# Patient Record
Sex: Male | Born: 1990 | Race: White | Hispanic: No | Marital: Single | Smoking: Current every day smoker
Health system: Southern US, Community
[De-identification: ages and names within clinical notes are randomized; demographics above are authoritative.]

---
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
## 2017-11-04 ENCOUNTER — Encounter (HOSPITAL_COMMUNITY): Payer: Self-pay | Admitting: *Deleted

## 2017-11-04 ENCOUNTER — Other Ambulatory Visit: Payer: Self-pay

## 2017-11-04 ENCOUNTER — Emergency Department (HOSPITAL_COMMUNITY)
Admission: EM | Admit: 2017-11-04 | Discharge: 2017-11-04 | Disposition: A | Discharge status: Home / Self Care | Payer: Self-pay | Attending: Emergency Medicine | Admitting: Emergency Medicine

## 2017-11-04 ENCOUNTER — Emergency Department (HOSPITAL_COMMUNITY): Payer: Self-pay

## 2017-11-04 DIAGNOSIS — R7989 Other specified abnormal findings of blood chemistry: Secondary | ICD-10-CM | POA: Insufficient documentation

## 2017-11-04 DIAGNOSIS — R197 Diarrhea, unspecified: Secondary | ICD-10-CM | POA: Insufficient documentation

## 2017-11-04 DIAGNOSIS — R109 Unspecified abdominal pain: Secondary | ICD-10-CM

## 2017-11-04 DIAGNOSIS — R1011 Right upper quadrant pain: Secondary | ICD-10-CM | POA: Insufficient documentation

## 2017-11-04 DIAGNOSIS — F17200 Nicotine dependence, unspecified, uncomplicated: Secondary | ICD-10-CM | POA: Insufficient documentation

## 2017-11-04 DIAGNOSIS — R112 Nausea with vomiting, unspecified: Secondary | ICD-10-CM | POA: Insufficient documentation

## 2017-11-04 LAB — COMPREHENSIVE METABOLIC PANEL
ALT: 26 U/L (ref 0–44)
ALT: 20 U/L (ref 0–44)
AST: 42 U/L — ABNORMAL HIGH (ref 15–41)
AST: 33 U/L (ref 15–41)
Albumin: 4.7 g/dL (ref 3.5–5.0)
Albumin: 3.6 g/dL (ref 3.5–5.0)
Alkaline Phosphatase: 60 U/L (ref 38–126)
Alkaline Phosphatase: 45 U/L (ref 38–126)
Anion gap: 17 — ABNORMAL HIGH (ref 5–15)
Anion gap: 13 (ref 5–15)
BUN: 23 mg/dL — ABNORMAL HIGH (ref 6–20)
BUN: 21 mg/dL — ABNORMAL HIGH (ref 6–20)
CHLORIDE: 106 mmol/L (ref 98–111)
CO2: 17 mmol/L — ABNORMAL LOW (ref 22–32)
CO2: 18 mmol/L — AB (ref 22–32)
Calcium: 9.3 mg/dL (ref 8.9–10.3)
Calcium: 8.4 mg/dL — ABNORMAL LOW (ref 8.9–10.3)
Chloride: 100 mmol/L (ref 98–111)
Creatinine, Ser: 2.17 mg/dL — ABNORMAL HIGH (ref 0.61–1.24)
Creatinine, Ser: 2.01 mg/dL — ABNORMAL HIGH (ref 0.61–1.24)
GFR calc Af Amer: 47 mL/min — ABNORMAL LOW (ref >60)
GFR calc non Af Amer: 40 mL/min — ABNORMAL LOW (ref >60)
GFR calc non Af Amer: 44 mL/min — ABNORMAL LOW (ref >60)
GFR, EST AFRICAN AMERICAN: 51 mL/min — AB (ref >60)
Glucose, Bld: 108 mg/dL — ABNORMAL HIGH (ref 70–99)
Glucose, Bld: 64 mg/dL — ABNORMAL LOW (ref 70–99)
Potassium: 4 mmol/L (ref 3.5–5.1)
Potassium: 4.4 mmol/L (ref 3.5–5.1)
SODIUM: 137 mmol/L (ref 135–145)
Sodium: 134 mmol/L — ABNORMAL LOW (ref 135–145)
Total Bilirubin: 1.4 mg/dL — ABNORMAL HIGH (ref 0.3–1.2)
Total Bilirubin: 1.5 mg/dL — ABNORMAL HIGH (ref 0.3–1.2)
Total Protein: 7.5 g/dL (ref 6.5–8.1)
Total Protein: 5.7 g/dL — ABNORMAL LOW (ref 6.5–8.1)

## 2017-11-04 LAB — CBG MONITORING, ED
GLUCOSE-CAPILLARY: 114 mg/dL — AB (ref 70–99)
Glucose-Capillary: 60 mg/dL — ABNORMAL LOW (ref 70–99)

## 2017-11-04 LAB — I-STAT CG4 LACTIC ACID, ED: LACTIC ACID, VENOUS: 1.02 mmol/L (ref 0.5–1.9)

## 2017-11-04 LAB — URINALYSIS, ROUTINE W REFLEX MICROSCOPIC
Bacteria, UA: NONE SEEN
Bilirubin Urine: NEGATIVE
Glucose, UA: NEGATIVE mg/dL
Ketones, ur: 20 mg/dL — AB
Leukocytes, UA: NEGATIVE
Nitrite: NEGATIVE
Protein, ur: 30 mg/dL — AB
Specific Gravity, Urine: 1.005 (ref 1.005–1.030)
pH: 5 (ref 5.0–8.0)

## 2017-11-04 LAB — LIPASE, BLOOD: Lipase: 26 U/L (ref 11–51)

## 2017-11-04 LAB — CBC
HCT: 49 % (ref 39.0–52.0)
Hemoglobin: 16.7 g/dL (ref 13.0–17.0)
MCH: 31.5 pg (ref 26.0–34.0)
MCHC: 34.1 g/dL (ref 30.0–36.0)
MCV: 92.3 fL (ref 78.0–100.0)
Platelets: 199 10*3/uL (ref 150–400)
RBC: 5.31 MIL/uL (ref 4.22–5.81)
RDW: 12.7 % (ref 11.5–15.5)
WBC: 15.7 10*3/uL — ABNORMAL HIGH (ref 4.0–10.5)

## 2017-11-04 MED ORDER — LACTATED RINGERS IV BOLUS
1000.0000 mL | Freq: Once | INTRAVENOUS | Status: AC
  Administered 2017-11-04: 1000 mL via INTRAVENOUS

## 2017-11-04 MED ORDER — ONDANSETRON HCL 4 MG/2ML IJ SOLN
4.0000 mg | Freq: Once | INTRAMUSCULAR | Status: AC
  Administered 2017-11-04: 4 mg via INTRAVENOUS
  Filled 2017-11-04: qty 2

## 2017-11-04 MED ORDER — MORPHINE SULFATE (PF) 4 MG/ML IV SOLN
4.0000 mg | Freq: Once | INTRAVENOUS | Status: AC
  Administered 2017-11-04: 4 mg via INTRAVENOUS
  Filled 2017-11-04: qty 1

## 2017-11-04 MED ORDER — ACETAMINOPHEN 500 MG PO TABS
1000.0000 mg | ORAL_TABLET | Freq: Once | ORAL | Status: AC
  Administered 2017-11-04: 1000 mg via ORAL
  Filled 2017-11-04: qty 2

## 2017-11-04 MED ORDER — SODIUM CHLORIDE 0.9 % IV BOLUS
1000.0000 mL | Freq: Once | INTRAVENOUS | Status: AC
  Administered 2017-11-04: 1000 mL via INTRAVENOUS

## 2017-11-04 MED ORDER — ONDANSETRON 4 MG PO TBDP: 4.0000 mg | ORAL_TABLET | Freq: Three times a day (TID) | ORAL | 0 refills | Status: DC | PRN

## 2017-11-04 NOTE — ED Notes (Signed)
Patient tolerating PO fluids 

## 2017-11-04 NOTE — ED Provider Notes (Signed)
[facility] Provider Note   CSN: [id number] Arrival date & time: 11/04/17  1551     History   Chief Complaint Chief Complaint  Patient presents with  . Back Pain    HPI Jon Maxwell is a 27 y.o. male with no pertinent past medical history who presents to the emergency department with severe, right-sided mid back pain, onset this a.m.  He states that the pain radiates from his right mid back down to his right lower back.  Pain has been constant, but has waxed and waned in severity from 2 to 8-9.  He states that the pain feels like it pulsating with intermittent sharpness.  No known aggravating or alleviating factors.  He reports 2 episodes of nonbloody, nonbilious emesis last night, but no episodes today.  He reports constant nausea since this morning.  He also reports several episodes of nonbloody loose stools, onset today.  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  He denies fever, chills, dyspnea, chest pain, hematuria, dysuria, frequency, constipation, penile discharge, penile or testicular pain or swelling, hematemesis, melena, or hematochezia.  No treatment prior to arrival.  The patient endorses drinking 4 beers last night.  He reports this is the first time he drank in 2 weeks.  He states that he will normally drink about 6 beers several times a month so this was actually less than he normally drinks.  He is a current, every day smoker.  He denies other recreational or illicit drug use and states that he avoids this because he is regularly drug tested for his job.  No over-the-counter medication use or over-the-counter supplements.  He reports that he has been eating and drinking well.  He has not spent any significant time out of the heat over the last few days.  He works from home.   The history is provided by the patient. No language interpreter was used.    History  reviewed. No pertinent past medical history.  There are no active problems to display for this patient.   History reviewed. No pertinent surgical history.      Home Medications    Prior to Admission medications   Medication Sig Start Date End Date Taking? Authorizing Provider  ondansetron (ZOFRAN ODT) 4 MG disintegrating tablet Take 1 tablet (4 mg total) by mouth every 8 (eight) hours as needed for nausea or vomiting. 11/04/17   Rasheda Ledger A, PA-C    Family History No family history on file.  Social History Social History   Tobacco Use  . Smoking status: Current Every Day Smoker  . Smokeless tobacco: Never Used  Substance Use Topics  . Alcohol use: Yes  . Drug use: Not on file     Allergies   Patient has no known allergies.   Review of Systems Review of Systems  Constitutional: Negative for appetite change, chills, diaphoresis and fever.  Respiratory: Negative for cough and shortness of breath.   Cardiovascular: Negative for chest pain.  Gastrointestinal: Positive for abdominal pain, diarrhea, nausea and vomiting. Negative for abdominal distention, anal bleeding, blood in stool, constipation and rectal pain.  Genitourinary: Positive for flank pain. Negative for dysuria, hematuria, penile pain, penile swelling, scrotal swelling and urgency.  Musculoskeletal: Positive for back pain. Negative for myalgias, neck pain and neck stiffness.  Skin: Negative for rash.  Allergic/Immunologic: Negative for immunocompromised state.  Neurological: Negative for  weakness and headaches.  Psychiatric/Behavioral: Negative for confusion.   Physical Exam Updated Vital Signs BP 114/72   Pulse 84   Temp 98.8 F (37.1 C) (Oral)   Resp 16   Ht 5\' 8"  (1.727 m)   Wt 61.2 kg (135 lb)   SpO2 95%   BMI 20.53 kg/m   Physical Exam  Constitutional: He appears well-developed. No distress.  Uncomfortable appearing, but nontoxic appearing.   HENT:  Head: Normocephalic.  Right Ear:  External ear normal.  Left Ear: External ear normal.  Eyes: Pupils are equal, round, and reactive to light. Conjunctivae and EOM are normal. No scleral icterus.  Neck: Neck supple.  Cardiovascular: Normal rate, regular rhythm, normal heart sounds and intact distal pulses. Exam reveals no gallop and no friction rub.  No murmur heard. Pulmonary/Chest: Effort normal and breath sounds normal. No stridor. No respiratory distress. He has no wheezes. He has no rales. He exhibits no tenderness.  Abdominal: Soft. Bowel sounds are normal. He exhibits no distension and no mass. There is tenderness. There is rebound and guarding. No hernia.  TTP in the RLQ and right flank, but he is diffusely TTP throughout the abdomen. Rebound tenderness in the RLQ. TTP over McBurney's point.  No guarding.  Significantly tender to palpation over the bilateral CVA region, right greater than left.  Abdomen is soft, nondistended.  Normoactive bowel sounds in all 4 quadrants.  Musculoskeletal: Normal range of motion. He exhibits no edema, tenderness or deformity.  Lymphadenopathy:    He has no cervical adenopathy.  Neurological: He is alert.  Skin: Skin is warm and dry. Capillary refill takes less than 2 seconds. No rash noted. He is not diaphoretic.  Psychiatric: His behavior is normal.  Nursing note and vitals reviewed.  ED Treatments / Results  Labs (all labs ordered are listed, but only abnormal results are displayed) Labs Reviewed  COMPREHENSIVE METABOLIC PANEL - Abnormal; Notable for the following components:      Result Value   Sodium 134 (*)    CO2 17 (*)    Glucose, Bld 108 (*)    BUN 23 (*)    Creatinine, Ser 2.17 (*)    AST 42 (*)    Total Bilirubin 1.4 (*)    GFR calc non Af Amer 40 (*)    GFR calc Af Amer 47 (*)    Anion gap 17 (*)    All other components within normal limits  CBC - Abnormal; Notable for the following components:   WBC 15.7 (*)    All other components within normal limits    URINALYSIS, ROUTINE W REFLEX MICROSCOPIC - Abnormal; Notable for the following components:   APPearance HAZY (*)    Hgb urine dipstick SMALL (*)    Ketones, ur 20 (*)    Protein, ur 30 (*)    All other components within normal limits  COMPREHENSIVE METABOLIC PANEL - Abnormal; Notable for the following components:   CO2 18 (*)    Glucose, Bld 64 (*)    BUN 21 (*)    Creatinine, Ser 2.01 (*)    Calcium 8.4 (*)    Total Protein 5.7 (*)    Total Bilirubin 1.5 (*)    GFR calc non Af Amer 44 (*)    GFR calc Af Amer 51 (*)    All other components within normal limits  CBG MONITORING, ED - Abnormal; Notable for the following components:   Glucose-Capillary 60 (*)    All other components  within normal limits  CBG MONITORING, ED - Abnormal; Notable for the following components:   Glucose-Capillary 114 (*)    All other components within normal limits  LIPASE, BLOOD  I-STAT CG4 LACTIC ACID, ED    EKG None  Radiology Ct Abdomen Pelvis Wo Contrast  Result Date: 11/04/2017 CLINICAL DATA:  The pt awakened this am with severe mid-back pain no urinary symptoms no blood in urine He has had several days with diarrhea. Pt has nausea and vomiting. EXAM: CT ABDOMEN AND PELVIS WITHOUT CONTRAST TECHNIQUE: Multidetector CT imaging of the abdomen and pelvis was performed following the standard protocol without Timotheus contrast. COMPARISON:  None. FINDINGS: Lower chest: Clear lung bases.  Heart normal size. Hepatobiliary: No focal liver abnormality is seen. No gallstones, gallbladder wall thickening, or biliary dilatation. Pancreas: Unremarkable. No pancreatic ductal dilatation or surrounding inflammatory changes. Spleen: Normal in size without focal abnormality. Adrenals/Urinary Tract: Adrenal glands are unremarkable. Kidneys are normal, without renal calculi, focal lesion, or hydronephrosis. Bladder is unremarkable. Stomach/Bowel: Stomach is within normal limits. Appendix appears normal. No evidence of bowel  wall thickening, distention, or inflammatory changes. Vascular/Lymphatic: No significant vascular findings are present. No enlarged abdominal or pelvic lymph nodes. Reproductive: Unremarkable. Other: No abdominal wall hernia or abnormality. No abdominopelvic ascites. Musculoskeletal: No acute or significant osseous findings. IMPRESSION: 1. Normal unenhanced CT scan of the abdomen pelvis. Electronically Signed   By: Amie Portland M.D.   On: 11/04/2017 19:14    Procedures Procedures (including critical care time)  Medications Ordered in ED Medications  sodium chloride 0.9 % bolus 1,000 mL (0 mLs Intravenous Stopped 11/04/17 1906)  ondansetron (ZOFRAN) injection 4 mg (4 mg Intravenous Given 11/04/17 1741)  morphine 4 MG/ML injection 4 mg (4 mg Intravenous Given 11/04/17 1742)  lactated ringers bolus 1,000 mL (0 mLs Intravenous Stopped 11/04/17 2114)  acetaminophen (TYLENOL) tablet 1,000 mg (1,000 mg Oral Given 11/04/17 2019)     Initial Impression / Assessment and Plan / ED Course  I have reviewed the triage vital signs and the nursing notes.  Pertinent labs & imaging results that were available during my care of the patient were reviewed by me and considered in my medical decision making (see chart for details).     27 year old male with no significant past medical history presenting with right-sided flank pain, diffuse abdominal pain, nausea, vomiting, and diarrhea.  On initial exam, patient appears uncomfortable, but nontoxic.  Afebrile without tachycardia.  Initial labs are notable for leukocytosis of 15.7, UA with small hemoglobinuria, proteinuria, and ketonuria, concerning for dehydration which the patient states that this is how he has felt before in high school when he was dehydrated.  Kolyn fluid bolus given.  Morphine and Zofran given for pain and nausea with significant improvement.  Metabolic panel with BUN/CR 23/2.17, AG 17, glu 108, mild hyponatremia of 134, and AST/ALT 42/26.  Given  exams and labs, with right flank pain and right-sided abdominal pain, there is exam for appendicitis versus obstructive nephropathy.  CT abdomen pelvis without contrast is ordered secondary to patient's creatinine, which is unremarkable for acute pathology.  These findings were discussed with the patient.  Discussed that we will order a second fluid bolus of lactated Ringer's and repeat CMP.  The patient was successfully fluid challenge.  He is requesting additional pain medicine and Tylenol was given with significant improvement.  Repeat CMP with minimal change in the patient's creatinine, 2.01.  Anion gap is improved as well as transaminases.  Shared these  findings with the patient and his mother.  He has never had previous blood work drawn to know if this is acute or chronic.  The patient was discussed with Dr. Juleen China, attending physician who recommends referral to nephrology for outpatient follow-up.  Nursing staff reports the patient's repeat CBG was 64 since the patient has been sleeping during his 2 fluid boluses.  He was given crackers and a snack, which he ate without difficulty.  Strict return precautions given.  The patient is hemodynamically stable and in no acute distress.  He is safe for discharge to home with an Rx for Zofran and Tylenol for pain control.  He was given strict instructions to avoid all NSAIDs until he is evaluated by nephrology.  Final Clinical Impressions(s) / ED Diagnoses   Final diagnoses:  Elevated serum creatinine  Nausea vomiting and diarrhea  Bilateral flank pain    ED Discharge Orders        Ordered    ondansetron (ZOFRAN ODT) 4 MG disintegrating tablet  Every 8 hours PRN     11/04/17 2224       Frederik Pear A, PA-C 11/04/17 2253    Raeford Razor, MD 11/05/17 1626

## 2017-11-04 NOTE — ED Triage Notes (Signed)
The pt awakened this am with severe mid-back pain no urinary symptoms no blood in urine  He has had several days with diarrhea  The pain is constant since it started

## 2017-11-04 NOTE — ED Notes (Signed)
Patient Alert and oriented to baseline. Stable and ambulatory to baseline. Patient verbalized understanding of the discharge instructions.  Patient belongings were taken by the patient.   

## 2017-11-04 NOTE — Discharge Instructions (Signed)
Thank you for allowing me to care for you today in the Emergency Department.   Call [facility] tomorrow morning to schedule follow-up appointment.  You can download the app MyChart to review your lab results from today's visit in the next few days.  Let 1 tablet of Zofran dissolve under your tongue every 8 hours as needed for nausea or vomiting.  Avoid all NSAID medications, including ibuprofen, Motrin, naproxen, and Naprosyn until you are seen by nephrology.  You can take thousand milligrams of Tylenol every 8 hours for pain control.   Return to the emergency department if you develop a high fever despite taking Tylenol, persistent vomiting despite taking Zofran, severe, uncontrollable pain, or other new, concerning symptoms.

## 2017-11-04 NOTE — ED Notes (Addendum)
Per PA, snack and drink given at this time. Will continue to monitor CBG.

## 2017-11-04 NOTE — ED Notes (Signed)
Patient is A&Ox4 at this time.  Patient in no signs of distress.  Please see providers note for complete history and physical exam.  

## 2019-01-04 ENCOUNTER — Ambulatory Visit (HOSPITAL_COMMUNITY)
Admission: EM | Admit: 2019-01-04 | Discharge: 2019-01-04 | Disposition: A | Discharge status: Home / Self Care | Payer: Self-pay | Attending: Family Medicine | Admitting: Family Medicine

## 2019-01-04 ENCOUNTER — Other Ambulatory Visit: Payer: Self-pay

## 2019-01-04 ENCOUNTER — Encounter (HOSPITAL_COMMUNITY): Payer: Self-pay | Admitting: Family Medicine

## 2019-01-04 DIAGNOSIS — Z202 Contact with and (suspected) exposure to infections with a predominantly sexual mode of transmission: Secondary | ICD-10-CM

## 2019-01-04 DIAGNOSIS — N342 Other urethritis: Secondary | ICD-10-CM | POA: Insufficient documentation

## 2019-01-04 LAB — POCT URINALYSIS DIP (DEVICE)
Bilirubin Urine: NEGATIVE
Glucose, UA: NEGATIVE mg/dL
Hgb urine dipstick: NEGATIVE
Ketones, ur: NEGATIVE mg/dL
Nitrite: NEGATIVE
Protein, ur: NEGATIVE mg/dL
Specific Gravity, Urine: 1.025 (ref 1.005–1.030)
Urobilinogen, UA: 1 mg/dL (ref 0.0–1.0)
pH: 6.5 (ref 5.0–8.0)

## 2019-01-04 MED ORDER — AZITHROMYCIN 250 MG PO TABS
ORAL_TABLET | ORAL | Status: AC
  Filled 2019-01-04: qty 4

## 2019-01-04 MED ORDER — AZITHROMYCIN 250 MG PO TABS
1000.0000 mg | ORAL_TABLET | Freq: Once | ORAL | Status: AC
  Administered 2019-01-04: 1000 mg via ORAL

## 2019-01-04 NOTE — ED Provider Notes (Signed)
[place]    CSN: [id number] Arrival date & time: 01/04/19  1438      History   Chief Complaint Chief Complaint  Patient presents with  . STD Testing    HPI Jon Maxwell is a 28 y.o. male.   This is the initial Jon Maxwell urgent care visit for this 28 year old man who presents with desire for STD testing.  Unprotected IC two weeks ago.  No discharge, but slight discomfort now at tip of penis.     History reviewed. No pertinent past medical history.  There are no active problems to display for this patient.   History reviewed. No pertinent surgical history.     Home Medications    Prior to Admission medications   Medication Sig Start Date End Date Taking? Authorizing Provider  ondansetron (ZOFRAN ODT) 4 MG disintegrating tablet Take 1 tablet (4 mg total) by mouth every 8 (eight) hours as needed for nausea or vomiting. 11/04/17   Maxwell, Jon A, PA-C    Family History Family History  Family history unknown: Yes    Social History Social History   Tobacco Use  . Smoking status: Current Every Day Smoker  . Smokeless tobacco: Never Used  Substance Use Topics  . Alcohol use: Yes  . Drug use: Not on file     Allergies   Patient has no known allergies.   Review of Systems Review of Systems  Constitutional: Negative for chills and fever.  Genitourinary: Positive for dysuria. Negative for discharge.  All other systems reviewed and are negative.    Physical Exam Triage Vital Signs ED Triage Vitals  Enc Vitals Group     BP      Pulse      Resp      Temp      Temp src      SpO2      Weight      Height      Head Circumference      Peak Flow      Pain Score      Pain Loc      Pain Edu?      Excl. in [employer]?    No data found.  Updated Vital Signs BP 121/83 (BP Location: Left Arm)   Pulse 85   Temp 98.4 F (36.9 C) (Oral)   Resp 18   SpO2 99%    Physical Exam Vitals signs and nursing note reviewed.  Constitutional:       Appearance: Normal appearance. He is normal weight.  Eyes:     Conjunctiva/sclera: Conjunctivae normal.  Neck:     Musculoskeletal: Normal range of motion and neck supple.  Pulmonary:     Effort: Pulmonary effort is normal.  Genitourinary:    Comments: Tip of penis slightly inflamed. Musculoskeletal: Normal range of motion.  Skin:    General: Skin is warm and dry.  Neurological:     General: No focal deficit present.     Mental Status: He is alert and oriented to person, place, and time.  Psychiatric:        Mood and Affect: Mood normal.        Behavior: Behavior normal.      UC Treatments / Results  Labs (all labs ordered are listed, but only abnormal results are displayed) Labs Reviewed  POCT URINALYSIS DIP (DEVICE) - Abnormal; Notable for the following components:      Result Value   Leukocytes,Ua SMALL (*)  All other components within normal limits  URINE CULTURE  CYTOLOGY, (ORAL, ANAL, URETHRAL) ANCILLARY ONLY    EKG   Radiology No results found.  Procedures Procedures (including critical care time)  Medications Ordered in UC Medications  azithromycin (ZITHROMAX) tablet 1,000 mg (1,000 mg Oral Given 01/04/19 1553)  azithromycin (ZITHROMAX) 250 MG tablet (has no administration in time range)    Initial Impression / Assessment and Plan / UC Course  I have reviewed the triage vital signs and the nursing notes.  Pertinent labs & imaging results that were available during my care of the patient were reviewed by me and considered in my medical decision making (see chart for details).    Final Clinical Impressions(s) / UC Diagnoses   Final diagnoses:  Urethritis     Discharge Instructions     We don't expect the tests to be back before Monday.    Because of your symptoms, we are giving you treatment for chlamydia today.  Avoid intercourse until all tests come back negative, or one week from now depending on which comes first.    ED  Prescriptions    None     Controlled Substance Prescriptions [place] Controlled Substance Registry consulted? Not Applicable   Jon Maxwell, Carlean Crowl, MD 01/04/19 1556

## 2019-01-04 NOTE — ED Triage Notes (Signed)
Pt presents for STD testing and treatment after having penile discomfort for over a month.

## 2019-01-04 NOTE — Discharge Instructions (Addendum)
We don't expect the tests to be back before Monday.    Because of your symptoms, we are giving you treatment for chlamydia today.  Avoid intercourse until all tests come back negative, or one week from now depending on which comes first.

## 2019-01-05 LAB — URINE CULTURE: Culture: NO GROWTH

## 2019-01-07 LAB — CYTOLOGY, (ORAL, ANAL, URETHRAL) ANCILLARY ONLY
Chlamydia: POSITIVE — AB
Neisseria Gonorrhea: NEGATIVE
Trichomonas: NEGATIVE

## 2019-01-08 ENCOUNTER — Telehealth (HOSPITAL_COMMUNITY): Payer: Self-pay | Admitting: Emergency Medicine

## 2019-01-08 ENCOUNTER — Encounter (HOSPITAL_COMMUNITY): Payer: Self-pay

## 2019-01-08 NOTE — Telephone Encounter (Signed)
Chlamydia is positive.  This was treated at the urgent care visit with po zithromax 1g.  Pt needs education to please refrain from sexual intercourse for 7 days to give the medicine time to work.  Sexual partners need to be notified and tested/treated.  Condoms may reduce risk of reinfection.  Recheck or followup with PCP for further evaluation if symptoms are not improving.  [facility] notified.  Attempted to reach patient. No answer at this time. Voicemail left.    

## 2019-01-10 ENCOUNTER — Telehealth (HOSPITAL_COMMUNITY): Payer: Self-pay | Admitting: Emergency Medicine

## 2019-01-10 NOTE — Telephone Encounter (Signed)
Attempted to reach patient x2. No answer at this time. Voicemail left.    

## 2019-01-13 ENCOUNTER — Telehealth (HOSPITAL_COMMUNITY): Payer: Self-pay | Admitting: Emergency Medicine

## 2019-01-13 NOTE — Telephone Encounter (Signed)
Attempted to reach patient x3. No answer at this time. Voicemail left. Letter sent    

## 2019-02-14 IMAGING — CT CT ABD-PELV W/O CM
2 of 4 series · 16 of 46 positions shown, 18 images · non-contrast
Comparison: None.

CLINICAL DATA: The pt awakened this am with severe mid-back pain no
urinary symptoms no blood in urine He has had several days with
diarrhea. Pt has nausea and vomiting.

EXAM:
CT ABDOMEN AND PELVIS WITHOUT CONTRAST
TECHNIQUE: Multidetector CT imaging of the abdomen and pelvis was performed
following the standard protocol without IV contrast.

[Series 3: a/p w/o 5mm · axial · non-contrast · 0.64mm/px · z∈[+597,+987]mm · 13 of 86 slices shown, 15 images]
[im 4/86  soft-tissue]
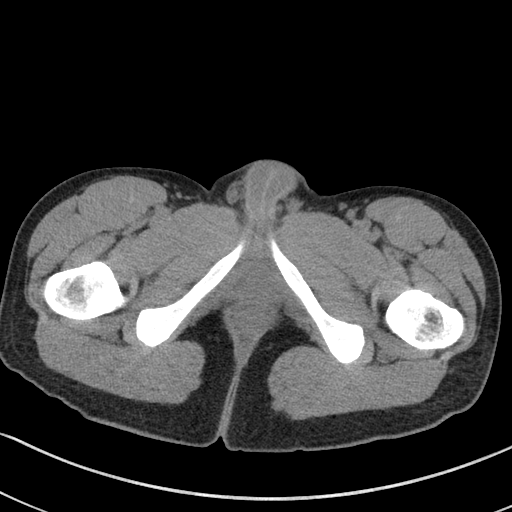
[im 4/86  bone]
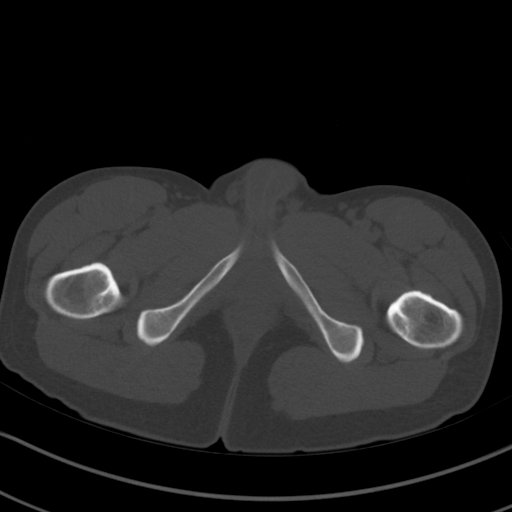
[im 11/86  soft-tissue]
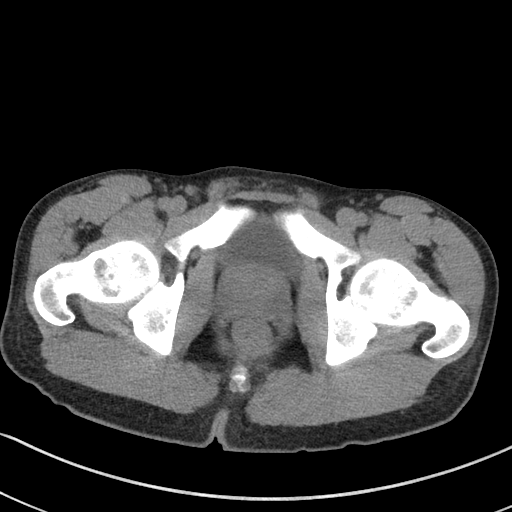
[im 18/86  soft-tissue]
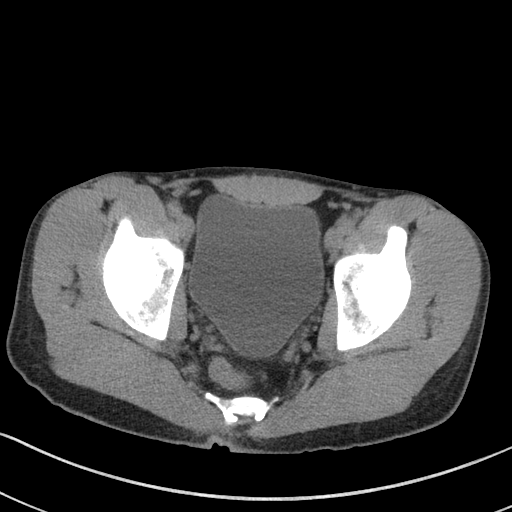
[im 24/86  soft-tissue]
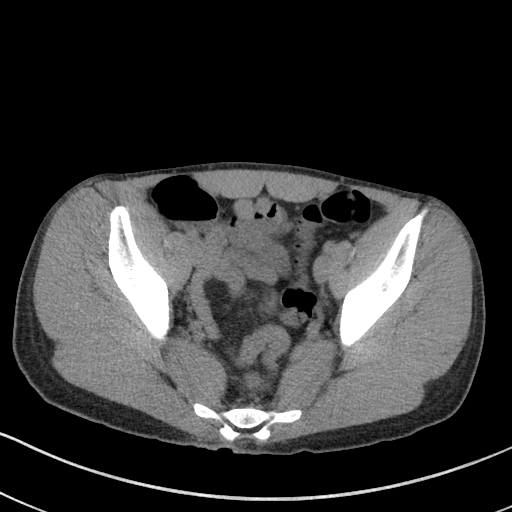
[im 31/86  soft-tissue]
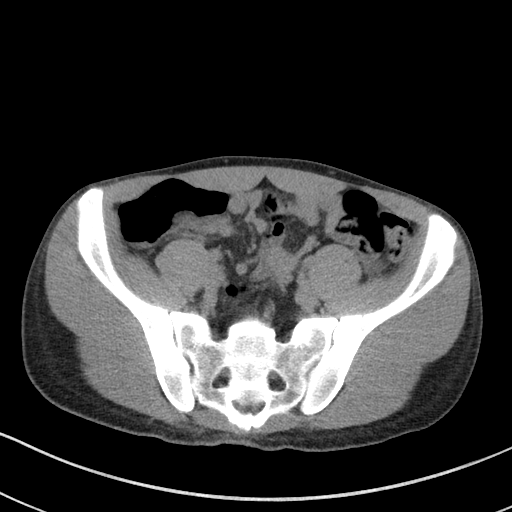
[im 38/86  soft-tissue]
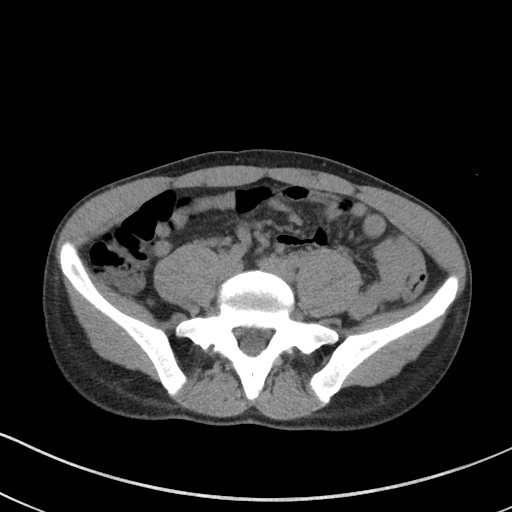
[im 45/86  soft-tissue]
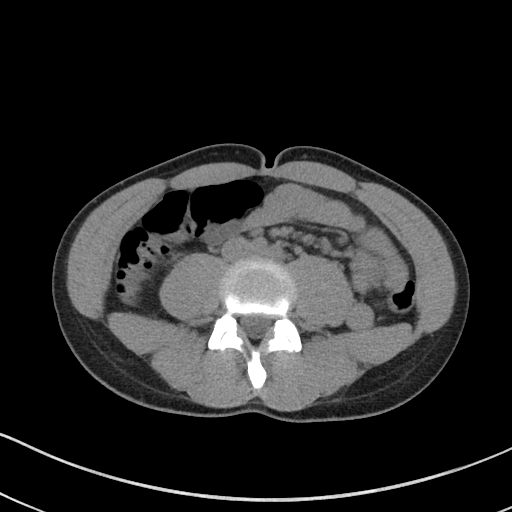
[im 48/86  soft-tissue]
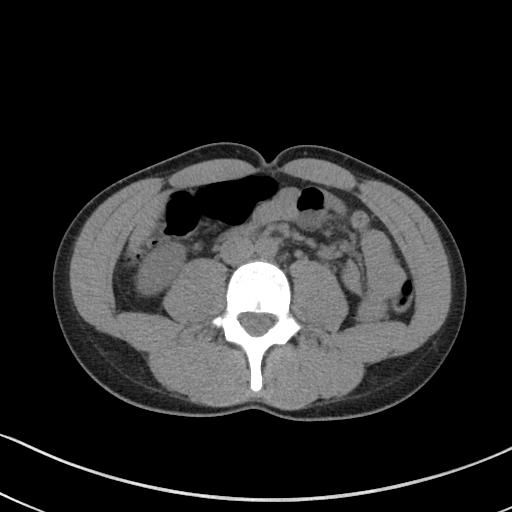
[im 55/86  soft-tissue]
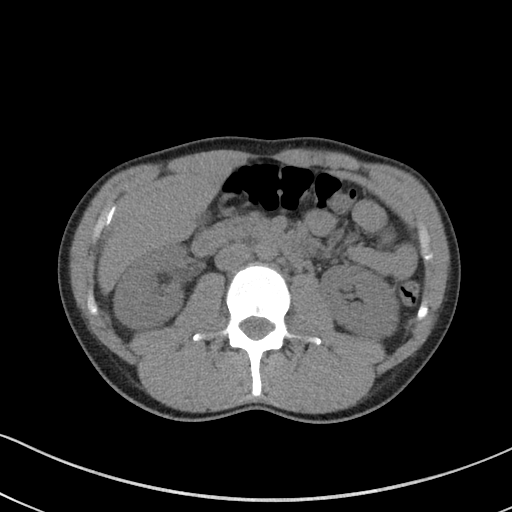
[im 55/86  bone]
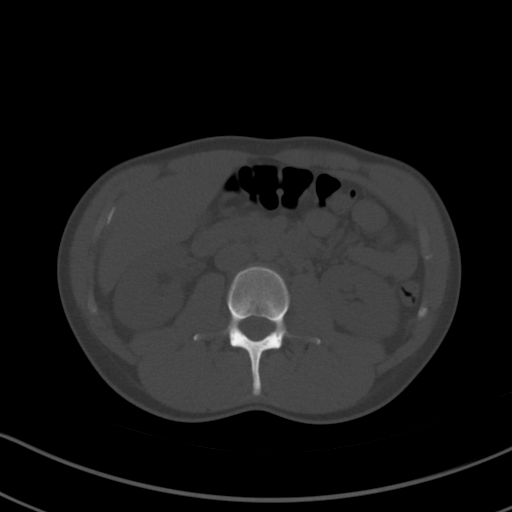
[im 62/86  soft-tissue]
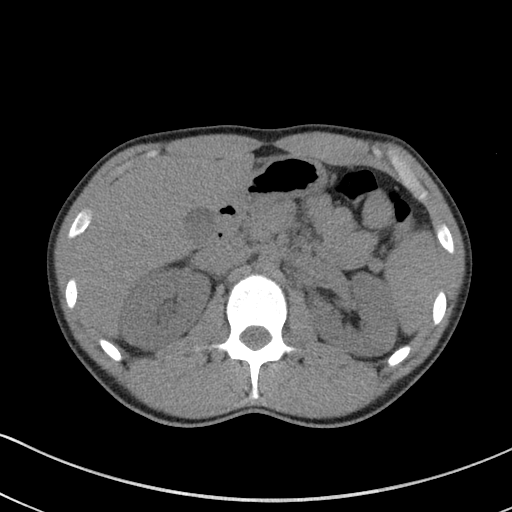
[im 69/86  soft-tissue]
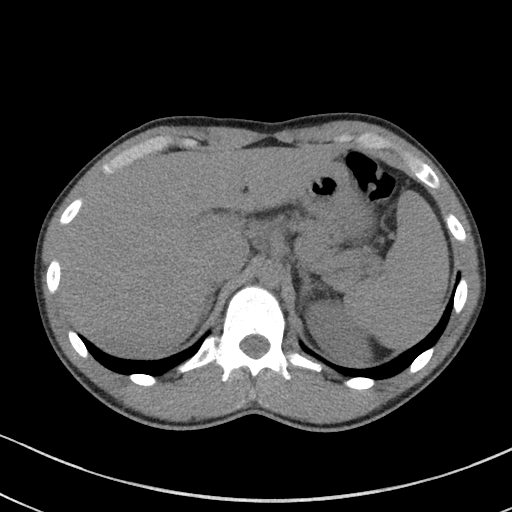
[im 75/86  soft-tissue]
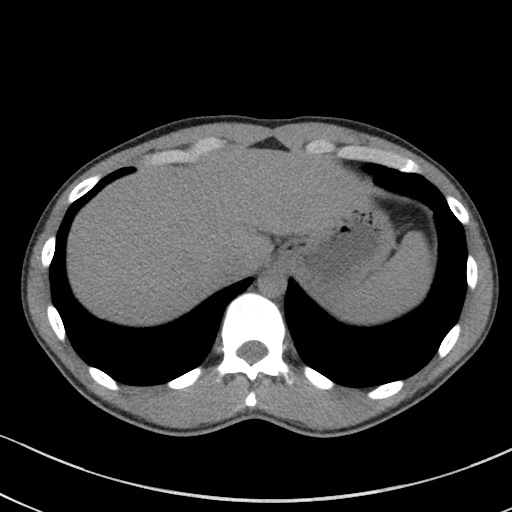
[im 82/86  soft-tissue]
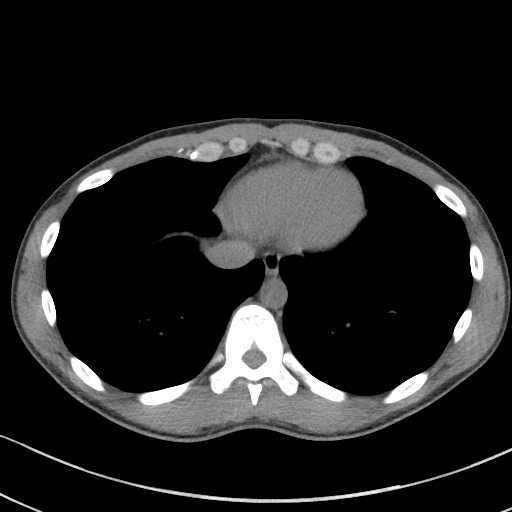

[Series 6: a/p w/o cor · coronal · non-contrast · 0.62mm/px · 3 of 103 slices shown]
[im 35/103  soft-tissue]
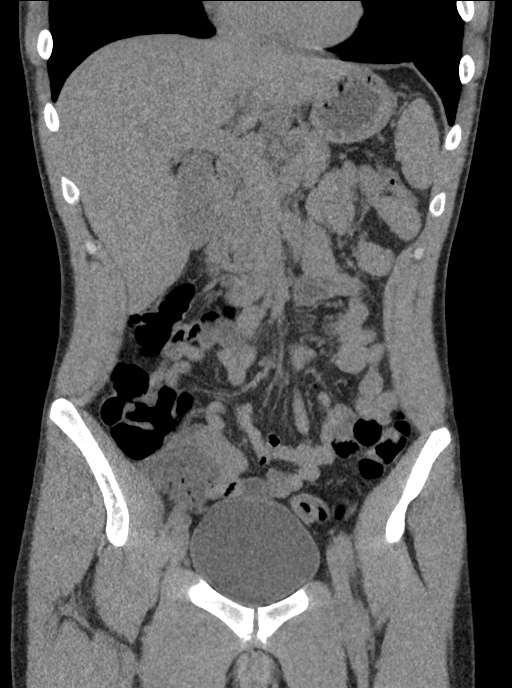
[im 46/103  soft-tissue]
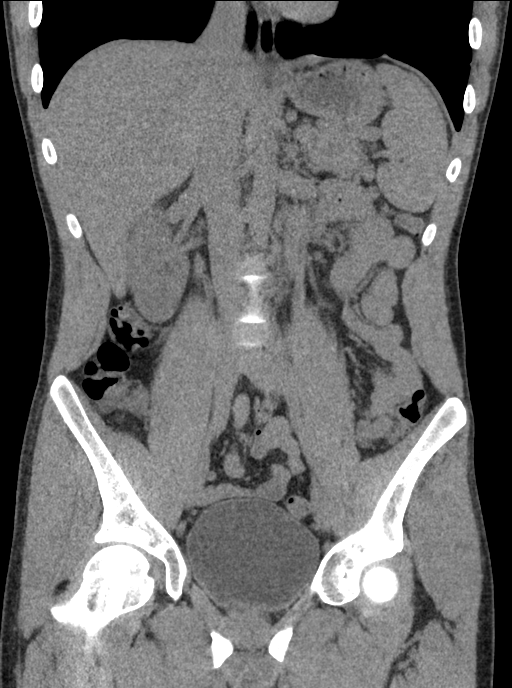
[im 57/103  soft-tissue]
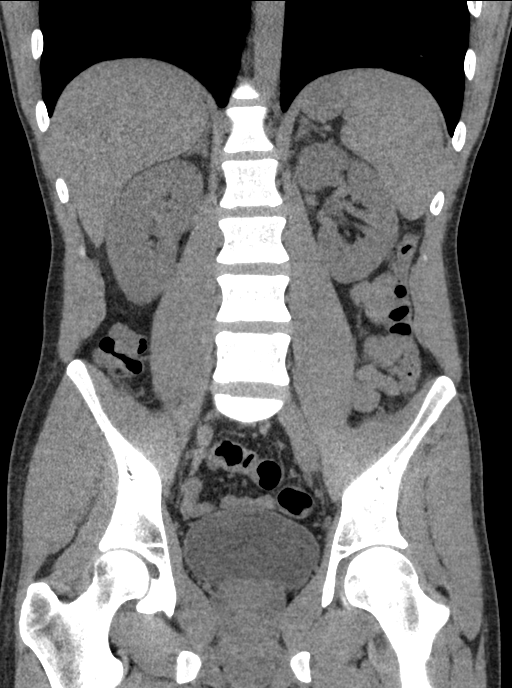

[16 of 46 positions shown; findings below may reference images not displayed]

FINDINGS: Lower chest: Clear lung bases.  Heart normal size.

Hepatobiliary: No focal liver abnormality is seen. No gallstones,
gallbladder wall thickening, or biliary dilatation.

Pancreas: Unremarkable. No pancreatic ductal dilatation or
surrounding inflammatory changes.

Spleen: Normal in size without focal abnormality.

Adrenals/Urinary Tract: Adrenal glands are unremarkable. Kidneys are
normal, without renal calculi, focal lesion, or hydronephrosis.
Bladder is unremarkable.

Stomach/Bowel: Stomach is within normal limits. Appendix appears
normal. No evidence of bowel wall thickening, distention, or
inflammatory changes.

Vascular/Lymphatic: No significant vascular findings are present. No
enlarged abdominal or pelvic lymph nodes.

Reproductive: Unremarkable.

Other: No abdominal wall hernia or abnormality. No abdominopelvic
ascites.

Musculoskeletal: No acute or significant osseous findings.
IMPRESSION: 1. Normal unenhanced CT scan of the abdomen pelvis.

## 2019-03-12 ENCOUNTER — Other Ambulatory Visit: Payer: Self-pay

## 2019-03-12 DIAGNOSIS — Z20822 Contact with and (suspected) exposure to covid-19: Secondary | ICD-10-CM

## 2019-03-14 LAB — NOVEL CORONAVIRUS, NAA: SARS-CoV-2, NAA: NOT DETECTED

## 2020-11-18 NOTE — Progress Notes (Signed)
Patient did not show for appointment.   

## 2020-11-19 ENCOUNTER — Encounter: Payer: Self-pay | Admitting: Family

## 2020-11-19 ENCOUNTER — Other Ambulatory Visit: Payer: Self-pay

## 2020-11-19 DIAGNOSIS — Z7689 Persons encountering health services in other specified circumstances: Secondary | ICD-10-CM

## 2020-11-24 ENCOUNTER — Encounter: Payer: Self-pay | Admitting: Family

## 2020-12-22 ENCOUNTER — Encounter: Payer: Self-pay | Admitting: Family Medicine

## 2020-12-22 ENCOUNTER — Other Ambulatory Visit: Payer: Self-pay

## 2020-12-22 DIAGNOSIS — Z5320 Procedure and treatment not carried out because of patient's decision for unspecified reasons: Secondary | ICD-10-CM

## 2020-12-22 NOTE — Progress Notes (Signed)
Patient is her to est care with provider. Patient said he would like a check-up.

## 2020-12-22 NOTE — Progress Notes (Signed)
Patient declined treatment as his insurance will not be re-instated until January 2023. He rescheduled his visit until that time

## 2021-05-18 ENCOUNTER — Ambulatory Visit: Payer: Self-pay | Admitting: Family Medicine

## 2022-05-17 ENCOUNTER — Ambulatory Visit (INDEPENDENT_AMBULATORY_CARE_PROVIDER_SITE_OTHER): Payer: Self-pay | Admitting: Family Medicine

## 2022-05-17 ENCOUNTER — Encounter: Payer: Self-pay | Admitting: Family Medicine

## 2022-05-17 ENCOUNTER — Other Ambulatory Visit (HOSPITAL_COMMUNITY)
Admission: RE | Admit: 2022-05-17 | Discharge: 2022-05-17 | Disposition: A | Discharge status: Home / Self Care | Payer: Self-pay | Source: Ambulatory Visit | Attending: Family Medicine | Admitting: Family Medicine

## 2022-05-17 VITALS — BP 134/79 | HR 89 | Temp 98.0°F | Ht 69.0 in | Wt 134.0 lb

## 2022-05-17 DIAGNOSIS — Z0001 Encounter for general adult medical examination with abnormal findings: Secondary | ICD-10-CM

## 2022-05-17 DIAGNOSIS — J342 Deviated nasal septum: Secondary | ICD-10-CM

## 2022-05-17 DIAGNOSIS — Z1159 Encounter for screening for other viral diseases: Secondary | ICD-10-CM

## 2022-05-17 DIAGNOSIS — Z114 Encounter for screening for human immunodeficiency virus [HIV]: Secondary | ICD-10-CM

## 2022-05-17 DIAGNOSIS — Z113 Encounter for screening for infections with a predominantly sexual mode of transmission: Secondary | ICD-10-CM

## 2022-05-17 DIAGNOSIS — R197 Diarrhea, unspecified: Secondary | ICD-10-CM

## 2022-05-17 DIAGNOSIS — Z131 Encounter for screening for diabetes mellitus: Secondary | ICD-10-CM

## 2022-05-17 DIAGNOSIS — Z13220 Encounter for screening for lipoid disorders: Secondary | ICD-10-CM

## 2022-05-17 DIAGNOSIS — F17200 Nicotine dependence, unspecified, uncomplicated: Secondary | ICD-10-CM

## 2022-05-17 LAB — COMPREHENSIVE METABOLIC PANEL
ALT: 16 U/L (ref 0–53)
AST: 19 U/L (ref 0–37)
Albumin: 4.8 g/dL (ref 3.5–5.2)
Alkaline Phosphatase: 65 U/L (ref 39–117)
BUN: 8 mg/dL (ref 6–23)
CO2: 27 mEq/L (ref 19–32)
Calcium: 9.7 mg/dL (ref 8.4–10.5)
Chloride: 104 mEq/L (ref 96–112)
Creatinine, Ser: 0.8 mg/dL (ref 0.40–1.50)
GFR: 118.88 mL/min (ref >60.00)
Glucose, Bld: 69 mg/dL — ABNORMAL LOW (ref 70–99)
Potassium: 4 mEq/L (ref 3.5–5.1)
Sodium: 141 mEq/L (ref 135–145)
Total Bilirubin: 0.9 mg/dL (ref 0.2–1.2)
Total Protein: 7.5 g/dL (ref 6.0–8.3)

## 2022-05-17 LAB — CBC
HCT: 49.5 % (ref 39.0–52.0)
Hemoglobin: 16.6 g/dL (ref 13.0–17.0)
MCHC: 33.6 g/dL (ref 30.0–36.0)
MCV: 96.8 fl (ref 78.0–100.0)
Platelets: 224 10*3/uL (ref 150.0–400.0)
RBC: 5.11 Mil/uL (ref 4.22–5.81)
RDW: 14.2 % (ref 11.5–15.5)
WBC: 7.2 10*3/uL (ref 4.0–10.5)

## 2022-05-17 LAB — LIPID PANEL
Cholesterol: 182 mg/dL (ref 0–200)
HDL: 54.4 mg/dL (ref >39.00)
LDL Cholesterol: 107 mg/dL — ABNORMAL HIGH (ref 0–99)
NonHDL: 127.9
Total CHOL/HDL Ratio: 3
Triglycerides: 107 mg/dL (ref 0.0–149.0)
VLDL: 21.4 mg/dL (ref 0.0–40.0)

## 2022-05-17 LAB — HEMOGLOBIN A1C: Hgb A1c MFr Bld: 4.9 % (ref 4.6–6.5)

## 2022-05-17 LAB — TSH: TSH: 1.56 u[IU]/mL (ref 0.35–5.50)

## 2022-05-17 NOTE — Assessment & Plan Note (Signed)
Patient was asked about his tobacco use today and was strongly advised to quit. Patient is currently contemplative. We reviewed treatment options to assist him quit smoking including NRT, Chantix, and Bupropion.  Does not wish to start any medications at this point.  He will try weaning down.  Follow up at next office visit.   Total time spent counseling approximately 3 minutes.
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
## 2022-05-17 NOTE — Assessment & Plan Note (Signed)
We did discuss referral to ENT for further management evaluation however he deferred for now.  He will let me know if he changes his mind.

## 2022-05-17 NOTE — Patient Instructions (Signed)
It was very nice to see you today!  We will check blood work today.  Please keep up the great work your diet and exercise to  Will check a urine sample also  Please let me know if your dietary issues persist.  Please try to avoid any triggering foods.  Let me know if you want a referral to see an ear nose and throat doctor to correct your deviated septum.  Please continue to wean down on your cigarettes.  Let me know if he would like to try medication for this.  Will see back in year for your next physical.  Come back sooner if needed.  Take care, Dr Jerline Pain  PLEASE NOTE:  If you had any lab tests, please let us know if you have not heard back within a few days. You may see your results on mychart before we have a chance to review them but we will give you a call once they are reviewed by [place].   If we ordered any referrals today, please let us know if you have not heard from their office within the next week.   If you had any urgent prescriptions sent in today, please check with the pharmacy within an hour of our visit to make sure the prescription was transmitted appropriately.   Please try these tips to maintain a healthy lifestyle:  Eat at least 3 REAL meals and 1-2 snacks per day.  Aim for no more than 5 hours between eating.  If you eat breakfast, please do so within one hour of getting up.   Each meal should contain half fruits/vegetables, one quarter protein, and one quarter carbs (no bigger than a computer mouse)  Cut down on sweet beverages. This includes juice, soda, and sweet tea.   Drink at least 1 glass of water with each meal and aim for at least 8 glasses per day  Exercise at least 150 minutes every week.    Preventive Care 28-53 Years Old, Male Preventive care refers to lifestyle choices and visits with your health care provider that can promote health and wellness. Preventive care visits are also called wellness exams. What can I expect for my preventive care  visit? Counseling During your preventive care visit, your health care provider may ask about your: Medical history, including: Past medical problems. Family medical history. Current health, including: Emotional well-being. Home life and relationship well-being. Sexual activity. Lifestyle, including: Alcohol, nicotine or tobacco, and drug use. Access to firearms. Diet, exercise, and sleep habits. Safety issues such as seatbelt and bike helmet use. Sunscreen use. Work and work Statistician. Physical exam Your health care provider may check your: Height and weight. These may be used to calculate your BMI (body mass index). BMI is a measurement that tells if you are at a healthy weight. Waist circumference. This measures the distance around your waistline. This measurement also tells if you are at a healthy weight and may help predict your risk of certain diseases, such as type 2 diabetes and high blood pressure. Heart rate and blood pressure. Body temperature. Skin for abnormal spots. What immunizations do I need?  Vaccines are usually given at various ages, according to a schedule. Your health care provider will recommend vaccines for you based on your age, medical history, and lifestyle or other factors, such as travel or where you work. What tests do I need? Screening Your health care provider may recommend screening tests for certain conditions. This may include: Lipid and cholesterol levels. Diabetes  screening. This is done by checking your blood sugar (glucose) after you have not eaten for a while (fasting). Hepatitis B test. Hepatitis C test. HIV (human immunodeficiency virus) test. STI (sexually transmitted infection) testing, if you are at risk. Talk with your health care provider about your test results, treatment options, and if necessary, the need for more tests. Follow these instructions at home: Eating and drinking  Eat a healthy diet that includes fresh fruits and  vegetables, whole grains, lean protein, and low-fat dairy products. Drink enough fluid to keep your urine pale yellow. Take vitamin and mineral supplements as recommended by your health care provider. Do not drink alcohol if your health care provider tells you not to drink. If you drink alcohol: Limit how much you have to 0-2 drinks a day. Know how much alcohol is in your drink. In the [place] one drink equals one 12 oz bottle of beer (355 mL), one 5 oz glass of wine (148 mL), or one 1 oz glass of hard liquor (44 mL). Lifestyle Brush your teeth every morning and night with fluoride toothpaste. Floss one time each day. Exercise for at least 30 minutes 5 or more days each week. Do not use any products that contain nicotine or tobacco. These products include cigarettes, chewing tobacco, and vaping devices, such as e-cigarettes. If you need help quitting, ask your health care provider. Do not use drugs. If you are sexually active, practice safe sex. Use a condom or other form of protection to prevent STIs. Find healthy ways to manage stress, such as: Meditation, yoga, or listening to music. Journaling. Talking to a trusted person. Spending time with friends and family. Minimize exposure to UV radiation to reduce your risk of skin cancer. Safety Always wear your seat belt while driving or riding in a vehicle. Do not drive: If you have been drinking alcohol. Do not ride with someone who has been drinking. If you have been using any mind-altering substances or drugs. While texting. When you are tired or distracted. Wear a helmet and other protective equipment during sports activities. If you have firearms in your house, make sure you follow all gun safety procedures. Seek help if you have been physically or sexually abused. What's next? Go to your health care provider once a year for an annual wellness visit. Ask your health care provider how often you should have your eyes and teeth  checked. Stay up to date on all vaccines. This information is not intended to replace advice given to you by your health care provider. Make sure you discuss any questions you have with your health care provider. Document Revised: 10/06/2020 Document Reviewed: 10/06/2020 Elsevier Patient Education  [place].

## 2022-05-18 LAB — URINE CYTOLOGY ANCILLARY ONLY
Chlamydia: POSITIVE — AB
Comment: NEGATIVE
Comment: NEGATIVE
Comment: NORMAL
Neisseria Gonorrhea: NEGATIVE
Trichomonas: NEGATIVE

## 2022-05-22 ENCOUNTER — Encounter: Payer: Self-pay | Admitting: Family Medicine

## 2022-05-25 ENCOUNTER — Other Ambulatory Visit: Payer: Self-pay | Admitting: *Deleted

## 2022-05-25 ENCOUNTER — Encounter: Payer: Self-pay | Admitting: Family Medicine

## 2022-05-25 DIAGNOSIS — E7800 Pure hypercholesterolemia, unspecified: Secondary | ICD-10-CM | POA: Insufficient documentation

## 2022-05-25 MED ORDER — DOXYCYCLINE HYCLATE 100 MG PO TABS: 100.0000 mg | ORAL_TABLET | Freq: Two times a day (BID) | ORAL | 0 refills | Status: AC

## 2022-05-25 NOTE — Progress Notes (Signed)
Please inform patient of the following:  His urine test was positive for chlamydia.  This is treatable.  Please send in prescription for doxycycline 100 mg twice daily x 7 days.  Please check with the lab regarding his other STD testing including HIV, hepatitis C, and RPR.  We should recheck his chlamydia test in 3 months.  His cholesterol is a little bit elevated but not significantly.  He should continue to work on diet and exercise and we can recheck in year.  Everything else is normal.

## 2022-05-25 NOTE — Addendum Note (Signed)
Addended by: Loura Back on: 05/25/2022 03:48 PM   Modules accepted: Orders

## 2022-05-31 ENCOUNTER — Other Ambulatory Visit: Payer: Self-pay

## 2022-05-31 DIAGNOSIS — Z114 Encounter for screening for human immunodeficiency virus [HIV]: Secondary | ICD-10-CM

## 2022-05-31 DIAGNOSIS — Z0001 Encounter for general adult medical examination with abnormal findings: Secondary | ICD-10-CM

## 2022-05-31 DIAGNOSIS — Z113 Encounter for screening for infections with a predominantly sexual mode of transmission: Secondary | ICD-10-CM

## 2022-05-31 DIAGNOSIS — Z1159 Encounter for screening for other viral diseases: Secondary | ICD-10-CM

## 2022-06-01 LAB — RPR: RPR Ser Ql: NONREACTIVE

## 2022-06-01 LAB — HEPATITIS C ANTIBODY: Hepatitis C Ab: NONREACTIVE

## 2022-06-01 LAB — HIV ANTIBODY (ROUTINE TESTING W REFLEX): HIV 1&2 Ab, 4th Generation: NONREACTIVE

## 2022-06-02 NOTE — Progress Notes (Signed)
Please inform patient of the following:  Good news! STD testing is all negative.  Jon Maxwell. Jerline Pain, MD 06/02/2022 12:42 PM

## 2024-06-06 ENCOUNTER — Ambulatory Visit: Payer: Self-pay | Admitting: Family Medicine
